# Patient Record
Sex: Male | Born: 2005 | Race: Black or African American | Hispanic: No | Marital: Single | Smoking: Never smoker
Health system: Southern US, Community
[De-identification: ages and names within clinical notes are randomized; demographics above are authoritative.]

## PROBLEM LIST (undated history)

## (undated) DIAGNOSIS — F909 Attention-deficit hyperactivity disorder, unspecified type: Secondary | ICD-10-CM

## (undated) DIAGNOSIS — J45909 Unspecified asthma, uncomplicated: Secondary | ICD-10-CM

## (undated) DIAGNOSIS — R569 Unspecified convulsions: Secondary | ICD-10-CM

## (undated) DIAGNOSIS — J309 Allergic rhinitis, unspecified: Secondary | ICD-10-CM

## (undated) HISTORY — DX: Unspecified asthma, uncomplicated: J45.909

## (undated) HISTORY — DX: Allergic rhinitis, unspecified: J30.9

## (undated) HISTORY — PX: CIRCUMCISION: SUR203

---
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
## 2024-11-28 ENCOUNTER — Institutional Professional Consult (permissible substitution) (INDEPENDENT_AMBULATORY_CARE_PROVIDER_SITE_OTHER): Admitting: Otolaryngology
